# Patient Record
Sex: Female | Born: 1980 | Race: White | Hispanic: Yes | Marital: Married | Smoking: Never smoker
Health system: Southern US, Community
[De-identification: ages and names within clinical notes are randomized; demographics above are authoritative.]

## PROBLEM LIST (undated history)

## (undated) DIAGNOSIS — T7840XA Allergy, unspecified, initial encounter: Secondary | ICD-10-CM

## (undated) DIAGNOSIS — F419 Anxiety disorder, unspecified: Secondary | ICD-10-CM

## (undated) DIAGNOSIS — T8859XA Other complications of anesthesia, initial encounter: Secondary | ICD-10-CM

## (undated) DIAGNOSIS — Z789 Other specified health status: Secondary | ICD-10-CM

## (undated) HISTORY — DX: Other complications of anesthesia, initial encounter: T88.59XA

## (undated) HISTORY — DX: Allergy, unspecified, initial encounter: T78.40XA

## (undated) HISTORY — DX: Anxiety disorder, unspecified: F41.9

---

## 2021-08-25 ENCOUNTER — Encounter (HOSPITAL_COMMUNITY): Payer: Self-pay | Admitting: Emergency Medicine

## 2021-08-25 ENCOUNTER — Emergency Department (HOSPITAL_COMMUNITY): Payer: BC Managed Care – PPO

## 2021-08-25 ENCOUNTER — Emergency Department (HOSPITAL_COMMUNITY)
Admission: EM | Admit: 2021-08-25 | Discharge: 2021-08-25 | Disposition: A | Discharge status: Home / Self Care | Payer: BC Managed Care – PPO | Attending: Obstetrics & Gynecology | Admitting: Obstetrics & Gynecology

## 2021-08-25 DIAGNOSIS — Z3A01 Less than 8 weeks gestation of pregnancy: Secondary | ICD-10-CM | POA: Diagnosis not present

## 2021-08-25 DIAGNOSIS — R109 Unspecified abdominal pain: Secondary | ICD-10-CM | POA: Insufficient documentation

## 2021-08-25 DIAGNOSIS — O26891 Other specified pregnancy related conditions, first trimester: Secondary | ICD-10-CM | POA: Diagnosis not present

## 2021-08-25 DIAGNOSIS — O4690 Antepartum hemorrhage, unspecified, unspecified trimester: Secondary | ICD-10-CM

## 2021-08-25 DIAGNOSIS — O3680X0 Pregnancy with inconclusive fetal viability, not applicable or unspecified: Secondary | ICD-10-CM | POA: Diagnosis not present

## 2021-08-25 DIAGNOSIS — O209 Hemorrhage in early pregnancy, unspecified: Secondary | ICD-10-CM | POA: Diagnosis not present

## 2021-08-25 DIAGNOSIS — Z6790 Unspecified blood type, Rh positive: Secondary | ICD-10-CM

## 2021-08-25 DIAGNOSIS — O09519 Supervision of elderly primigravida, unspecified trimester: Secondary | ICD-10-CM | POA: Diagnosis not present

## 2021-08-25 DIAGNOSIS — Z3A00 Weeks of gestation of pregnancy not specified: Secondary | ICD-10-CM | POA: Diagnosis not present

## 2021-08-25 HISTORY — DX: Other specified health status: Z78.9

## 2021-08-25 LAB — CBC
HCT: 36.7 % (ref 36.0–46.0)
Hemoglobin: 13.2 g/dL (ref 12.0–15.0)
MCH: 33.1 pg (ref 26.0–34.0)
MCHC: 36 g/dL (ref 30.0–36.0)
MCV: 92 fL (ref 80.0–100.0)
Platelets: 316 10*3/uL (ref 150–400)
RBC: 3.99 MIL/uL (ref 3.87–5.11)
RDW: 11.5 % (ref 11.5–15.5)
WBC: 6.3 10*3/uL (ref 4.0–10.5)
nRBC: 0 % (ref 0.0–0.2)

## 2021-08-25 LAB — ABO/RH: ABO/RH(D): A POS

## 2021-08-25 LAB — WET PREP, GENITAL
Clue Cells Wet Prep HPF POC: NONE SEEN
Sperm: NONE SEEN
Trich, Wet Prep: NONE SEEN
WBC, Wet Prep HPF POC: <10 (ref <10)
Yeast Wet Prep HPF POC: NONE SEEN

## 2021-08-25 LAB — HCG, QUANTITATIVE, PREGNANCY: hCG, Beta Chain, Quant, S: 21 m[IU]/mL — ABNORMAL HIGH (ref <5)

## 2021-08-25 LAB — I-STAT BETA HCG BLOOD, ED (MC, WL, AP ONLY): I-stat hCG, quantitative: 25.1 m[IU]/mL — ABNORMAL HIGH (ref <5)

## 2021-08-25 NOTE — MAU Note (Signed)
Sent up from ER.  +HPT x2 yesterday. Had some dark spotting last night, was red spotting this morning.  Last night had a little cramping. cramping in lower abd and lower back.  Feeling a little dizzy

## 2021-08-25 NOTE — ED Provider Notes (Signed)
Patient is a 41 year old female who presents to the emergency department from urgent care for further evaluation of vaginal bleeding and abdominal cramping x1 day.  Patient had a positive urine pregnancy test at urgent care.  Beta-hCG obtained in ER triage, positive at 25.1.  She is alert, oriented, and in no acute distress in triage.  She is hemodynamically stable.  Called MAU APP who accepted patient transfer.   Estill Cotta 08/25/21 1543    Milton Ferguson, MD 08/27/21 1125

## 2021-08-25 NOTE — ED Triage Notes (Signed)
Patient to [facility] from urgent care for further evaluation of vaginal bleeding and cramping x1 day with positive pregnancy test. Patient alert, oriented, and in no apparent distress at this time.

## 2021-08-25 NOTE — MAU Provider Note (Signed)
History     CSN: [id number]  Arrival date and time: 08/25/21 1320   Event Date/Time   First Provider Initiated Contact with Patient 08/25/21 1704      Chief Complaint  Patient presents with   Vaginal Bleeding   Abdominal Pain   Back Pain   41 y.o. [id number] @[redacted]w[redacted]d  by sure LMP presenting with VB and cramping. Reports onset today. Bleeding has been light and just spotting. Cramping is mild and intermittent. Rates 2/10. Has not treated it. Had 2 positive HPT yesterday.   OB History     Gravida  5   Para  2   Term      Preterm      AB  2   Living  2      SAB      IAB  2   Ectopic      Multiple      Live Births              Past Medical History:  Diagnosis Date   Medical history non-contributory     Past Surgical History:  Procedure Laterality Date   CESAREAN SECTION      History reviewed. No pertinent family history.  Social History   Tobacco Use   Smoking status: Never   Smokeless tobacco: Never  Vaping Use   Vaping Use: Never used  Substance Use Topics   Alcohol use: Never   Drug use: Never    Allergies:  Allergies  Allergen Reactions   Latex     No medications prior to admission.    Review of Systems  Constitutional:  Negative for fever.  Gastrointestinal:  Positive for abdominal pain. Negative for diarrhea, nausea and vomiting.  Genitourinary:  Positive for vaginal bleeding. Negative for vaginal discharge.  Physical Exam   Blood pressure 113/89, pulse 77, temperature 98.9 F (37.2 C), temperature source Oral, resp. rate 18, weight 81.1 kg, last menstrual period 07/23/2021, SpO2 100 %.  Physical Exam Vitals and nursing note reviewed.  Constitutional:      General: She is not in acute distress.    Appearance: Normal appearance.  HENT:     Head: Normocephalic and atraumatic.  Cardiovascular:     Rate and Rhythm: Normal rate.  Pulmonary:     Effort: Pulmonary effort is normal. No respiratory distress.  Abdominal:      General: There is no distension.     Palpations: Abdomen is soft. There is no mass.     Tenderness: There is no abdominal tenderness. There is no guarding or rebound.     Hernia: No hernia is present.  Musculoskeletal:        General: Normal range of motion.     Cervical back: Normal range of motion.  Skin:    General: Skin is warm and dry.  Neurological:     General: No focal deficit present.     Mental Status: She is alert and oriented to person, place, and time.  Psychiatric:        Mood and Affect: Mood normal.        Behavior: Behavior normal.   Results for orders placed or performed during the hospital encounter of 08/25/21 (from the past 24 hour(s))  I-Stat beta hCG blood, ED     Status: Abnormal   Collection Time: 08/25/21  3:23 PM  Result Value Ref Range   I-stat hCG, quantitative 25.1 (H) <5 mIU/mL   Comment 3  ABO/Rh     Status: None   Collection Time: 08/25/21  4:33 PM  Result Value Ref Range   ABO/RH(D) A POS    No rh immune globuloin      NOT A RH IMMUNE GLOBULIN CANDIDATE, PT RH POSITIVE Performed at [facility] [address], [place], [place] [address]   CBC     Status: None   Collection Time: 08/25/21  4:33 PM  Result Value Ref Range   WBC 6.3 4.0 - 10.5 K/uL   RBC 3.99 3.87 - 5.11 MIL/uL   Hemoglobin 13.2 12.0 - 15.0 g/dL   HCT 36.7 36.0 - 46.0 %   MCV 92.0 80.0 - 100.0 fL   MCH 33.1 26.0 - 34.0 pg   MCHC 36.0 30.0 - 36.0 g/dL   RDW 11.5 11.5 - 15.5 %   Platelets 316 150 - 400 K/uL   nRBC 0.0 0.0 - 0.2 %  hCG, quantitative, pregnancy     Status: Abnormal   Collection Time: 08/25/21  4:33 PM  Result Value Ref Range   hCG, Beta Chain, Quant, S 21 (H) <5 mIU/mL  Wet prep, genital     Status: None   Collection Time: 08/25/21  5:17 PM   Specimen: PATH Cytology Cervicovaginal Ancillary Only  Result Value Ref Range   Yeast Wet Prep HPF POC NONE SEEN NONE SEEN   Trich, Wet Prep NONE SEEN NONE SEEN   Clue Cells Wet Prep HPF POC NONE SEEN  NONE SEEN   WBC, Wet Prep HPF POC <10 <10   Sperm NONE SEEN    US OB LESS THAN 14 WEEKS WITH OB TRANSVAGINAL  Result Date: 08/25/2021 CLINICAL DATA:  Vaginal bleeding EXAM: OBSTETRIC <14 WK [place] AND TRANSVAGINAL OB US TECHNIQUE: Both transabdominal and transvaginal ultrasound examinations were performed for complete evaluation of the gestation as well as the maternal uterus, adnexal regions, and pelvic cul-de-sac. Transvaginal technique was performed to assess early pregnancy. COMPARISON:  None. FINDINGS: Intrauterine gestational sac: None Yolk sac:  Not Visualized. Embryo:  Not Visualized. Cardiac Activity: Not Visualized. Heart Rate:   bpm MSD:   mm    w     d CRL:    mm    w    d                  [place] EDC: Subchorionic hemorrhage:  None visualized. Maternal uterus/adnexae: No adnexal mass or free fluid. IMPRESSION: No intrauterine pregnancy visualized. Differential considerations would include early intrauterine pregnancy too early to visualize, spontaneous abortion, or occult ectopic pregnancy. Recommend close clinical followup and serial quantitative beta HCGs and ultrasounds. Electronically Signed   By: Rolm Baptise M.D.   On: 08/25/2021 17:57    MAU Course  Procedures  MDM Labs and [place] ordered and reviewed. Qhcg low and no IUGS, YS or FP seen on [place], findings likely indicate early pregnancy, but cannot r/o ectopic pregnancy, or failed pregnancy, discussed with pt. Will follow quant in 48 hrs. Stable for discharge home.   Assessment and Plan   1. Pregnancy, location unknown   2. Vaginal bleeding in pregnancy   3. Blood type, Rh positive    Discharge home Follow up at [facility] on 08/28/21 @0900  SAB/ectopic precautions Pelvic rest  Allergies as of 08/25/2021       Reactions   Latex         Medication List    You have not been prescribed any medications.    Threasa Beards  Drake Leach, CNM 08/25/2021, 6:42 PM

## 2021-08-26 LAB — GC/CHLAMYDIA PROBE AMP (~~LOC~~) NOT AT ARMC
Chlamydia: NEGATIVE
Comment: NEGATIVE
Comment: NORMAL
Neisseria Gonorrhea: NEGATIVE

## 2021-08-28 ENCOUNTER — Ambulatory Visit (INDEPENDENT_AMBULATORY_CARE_PROVIDER_SITE_OTHER): Payer: BC Managed Care – PPO | Admitting: *Deleted

## 2021-08-28 ENCOUNTER — Other Ambulatory Visit: Payer: Self-pay

## 2021-08-28 VITALS — BP 118/83 | HR 73 | Ht 67.0 in | Wt 178.5 lb

## 2021-08-28 DIAGNOSIS — O3680X0 Pregnancy with inconclusive fetal viability, not applicable or unspecified: Secondary | ICD-10-CM | POA: Diagnosis not present

## 2021-08-28 DIAGNOSIS — Z77011 Contact with and (suspected) exposure to lead: Secondary | ICD-10-CM | POA: Diagnosis not present

## 2021-08-28 LAB — BETA HCG QUANT (REF LAB): hCG Quant: 2 m[IU]/mL

## 2021-08-28 NOTE — Progress Notes (Signed)
Patient was assessed and managed by nursing staff during this encounter. I have reviewed the chart and agree with the documentation and plan.   Jaynie Collins, MD 08/28/2021 1:38 PM

## 2021-08-28 NOTE — Progress Notes (Signed)
Stat bhcg results received = 2. Reviewed results and assessment with Dr. Harolyn Rutherford as Dr. Dione Plover not available. Results indicate patient has had a miscarriage and is not pregnant. She does not need any further lab draws but may be offered sab followup if desired.  I called patient with [facility] # [phone] and left a message I was calling with results and as we are closed , she should call the office Monday for results. Tibor Lemmons,RN

## 2021-08-28 NOTE — Progress Notes (Signed)
Hx MAU 08/25/21 with vaginal bleeding, abd and back pain. Had stat bhcg and US done. Here today for stat bhcg. Denies pain.States has been spotting a little since MAU visit- states sees tissue sometimes.Has been wearing pads.  Explained we will draw stat bhcg and have her leave office. She will be called with results in about 2 hours after results received and reviewed by provider.  She voices understanding.   She also asked if exposure to lead can cause miscarriage. 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 She wears masks and gloves. Discussed with Dr.Eckstat and he ordered lead level to see if her PPE is protecting her because exposure to lead does increase risk of miscarriage. Explained to patient and she agrees with plan.  Roland Prine,RN

## 2021-08-28 NOTE — Progress Notes (Signed)
I called patient again with [employer] [phone] and left another message I am calling with results, and since we are closed she can call our office Monday for results  Ennio Houp,RN

## 2021-08-31 NOTE — Progress Notes (Signed)
Pt came to office today for results and recommendations since not being able to be reached on Friday.  Spoke with pt in serenity room with [employer]quel-interpreter. Pt given results of SAB and answered all questions. Pt also asking about LEAD labs. Pt advised results are not back yet,but will be notified by provider if positive. Pt verbalized understanding.  Jasmine Ho

## 2021-09-01 LAB — LEAD, BLOOD (ADULT >= 16 YRS): Lead-Whole Blood: 19.4 ug/dL — ABNORMAL HIGH (ref 0.0–3.4)

## 2021-09-04 NOTE — Progress Notes (Signed)
Please call patient and let her know the results. I have reviewed the literature and there is an association between lead levels and spontaneous miscarriage, so this may have contributed to her pregnancy loss though it has to be said there are likely many other contributing factors.

## 2021-09-07 ENCOUNTER — Telehealth: Payer: Self-pay

## 2021-09-07 NOTE — Telephone Encounter (Addendum)
-----   Message from Venora Maples, MD sent at 09/04/2021  2:12 PM EST ----- Please call patient and let her know the results. I have reviewed the literature and there is an association between lead levels and spontaneous miscarriage, so this may have contributed to her pregnancy loss though it has to be said there are likely many other contributing factors.   Called pt with [employer] Ra[employer] M., left message that I am calling with results to please return call.    Leonette Nutting  09/07/21

## 2021-09-08 NOTE — Telephone Encounter (Signed)
Called pt with interpreter Raquel. Reviewed that there is association between lead levels and spontaneous miscarriage. Pt asks what is normal level and what was her exact result; reviewed normal parameters and result with patient. Pt asks if there is anything she would do to lower these levels and to prevent future exposure. Explained I will review questions with a provider and someone will call to follow up.

## 2021-09-15 ENCOUNTER — Telehealth: Payer: Self-pay | Admitting: Family Medicine

## 2021-09-15 NOTE — Telephone Encounter (Signed)
Discussed patient's lead level on the phone. Patient with many excellent questions.   First asked if there was anything she could do to reduce her lead level, specifically a medication. Reviewed that at her blood level (19) it is not recommended to take any medications. 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 She should also separate work clothes from regular clothes.  Also asked what optimal lead level is, we discussed it is 0, though technically OSHA recommends <30.   She also asked if there as an association with lead and miscarriage. We discussed there is, though not definitively proven and she has many other confounding factors such as her age.   Asked if there was anything else she could be doing to help her get pregnant or if she should wait to try. We discussed prenatals and trying immediately given her age as likely the best course of action.   All questions answered. 13 minutes in total spent reviewing chart and speaking with patient.

## 2021-10-15 ENCOUNTER — Encounter: Payer: Self-pay | Admitting: Family Medicine

## 2021-10-15 ENCOUNTER — Ambulatory Visit (INDEPENDENT_AMBULATORY_CARE_PROVIDER_SITE_OTHER): Payer: BC Managed Care – PPO | Admitting: Family Medicine

## 2021-10-15 ENCOUNTER — Other Ambulatory Visit: Payer: Self-pay

## 2021-10-15 DIAGNOSIS — F4321 Adjustment disorder with depressed mood: Secondary | ICD-10-CM

## 2021-10-15 DIAGNOSIS — Z7689 Persons encountering health services in other specified circumstances: Secondary | ICD-10-CM

## 2021-10-15 DIAGNOSIS — Z789 Other specified health status: Secondary | ICD-10-CM | POA: Diagnosis not present

## 2021-10-15 MED ORDER — CITALOPRAM HYDROBROMIDE 10 MG PO TABS: 10.0000 mg | ORAL_TABLET | Freq: Every day | ORAL | 0 refills | Status: DC

## 2021-10-15 NOTE — Progress Notes (Signed)
Patient is her to establish care.Patient is c/o her hips hurting from injection.Patient had injection 14yrs ago and has scar tissues that is bothering her. ?

## 2021-10-16 NOTE — Progress Notes (Signed)
? ?  New Patient Office Visit ? ?Subjective:  ?Patient ID: Jasmine Ho, female    DOB: September 08, 1980  Age: 41 y.o. MRN: [id number] ? ?CC:  ?Chief Complaint  ?Patient presents with  ? Establish Care  ? ? ?HPI ?Jasmine Ho presents for to establish care. Patient reports that she has had a lot of social stressors recently but that her now husband has been supportive. This visit was aided by an interpreter.  ? ?Past Medical History:  ?Diagnosis Date  ? Medical history non-contributory   ? ? ?Past Surgical History:  ?Procedure Laterality Date  ? CESAREAN SECTION    ? ? ?History reviewed. No pertinent family history. ? ?Social History  ? ?Socioeconomic History  ? Marital status: Married  ?  Spouse name: Not on file  ? Number of children: Not on file  ? Years of education: Not on file  ? Highest education level: Not on file  ?Occupational History  ? Not on file  ?Tobacco Use  ? Smoking status: Never  ? Smokeless tobacco: Never  ?Vaping Use  ? Vaping Use: Never used  ?Substance and Sexual Activity  ? Alcohol use: Never  ? Drug use: Never  ? Sexual activity: Yes  ?Other Topics Concern  ? Not on file  ?Social History Narrative  ? Not on file  ? ?Social Determinants of Health  ? ?Financial Resource Strain: Not on file  ?Food Insecurity: Not on file  ?Transportation Needs: Not on file  ?Physical Activity: Not on file  ?Stress: Not on file  ?Social Connections: Not on file  ?Intimate Partner Violence: Not on file  ? ? ?ROS ?Review of Systems  ?Psychiatric/Behavioral:  Positive for sleep disturbance. Negative for self-injury and suicidal ideas. The patient is not nervous/anxious.   ?All other systems reviewed and are negative. ? ?Objective:  ? ?Today's Vitals: LMP 07/23/2021 (Exact Date)  ? ?Physical Exam ?Vitals and nursing note reviewed.  ?Constitutional:   ?   General: She is not in acute distress. ?Cardiovascular:  ?   Rate and Rhythm: Normal rate and regular rhythm.  ?Pulmonary:  ?   Effort: Pulmonary effort is normal.  ?    Breath sounds: Normal breath sounds.  ?Abdominal:  ?   Palpations: Abdomen is soft.  ?   Tenderness: There is no abdominal tenderness.  ?Neurological:  ?   General: No focal deficit present.  ?   Mental Status: She is alert and oriented to person, place, and time.  ?Psychiatric:     ?   Mood and Affect: Mood and affect normal.     ?   Behavior: Behavior normal. Behavior is cooperative.  ? ? ?Assessment & Plan:  ? ?1. Situational depression ?Patient prescribed Celexa 10 mg daily. Will monitor ? ?2. Language barrier to communication ? ? ?3. Encounter to establish care ? ? ? ? ?Outpatient Encounter Medications as of 10/15/2021  ?Medication Sig  ? citalopram (CELEXA) 10 MG tablet Take 1 tablet (10 mg total) by mouth daily.  ? ?No facility-administered encounter medications on file as of 10/15/2021.  ? ? ?Follow-up: Return in about 4 weeks (around 11/12/2021) for physical.  ? ?Jasmine Raymond, MD ? ?

## 2021-11-16 ENCOUNTER — Ambulatory Visit (INDEPENDENT_AMBULATORY_CARE_PROVIDER_SITE_OTHER): Payer: BC Managed Care – PPO

## 2021-11-16 DIAGNOSIS — Z3201 Encounter for pregnancy test, result positive: Secondary | ICD-10-CM

## 2021-11-16 LAB — POCT PREGNANCY, URINE: Preg Test, Ur: POSITIVE — AB

## 2021-11-16 NOTE — Progress Notes (Signed)
Patient dropped off urine for pregnancy test. Urine pregnancy test positive. I called patient with the help of Raquel spanish interpreter to review these results. Per patient she had a positive pregnancy test at home on 11/15/21. Per patient she has some mild discomfort in her abdomen but denies any vaginal bleeding. I reviewed ectopic and bleeding precautions with patient. Per patient her LMP was 10/19/21 making her [redacted]w[redacted]d today with an EDD of 07/26/22. Per patient she would like to receive prenatal care here at the [facility]. New OB appointment scheduled. I recommended patient begin taking a prenatal vitamin. Patient denies any other questions or concerns.  ? ?Alesia Richards, RN ?11/16/21 ?

## 2021-12-16 ENCOUNTER — Encounter: Payer: BC Managed Care – PPO | Admitting: Family Medicine

## 2021-12-23 ENCOUNTER — Telehealth (INDEPENDENT_AMBULATORY_CARE_PROVIDER_SITE_OTHER): Payer: BC Managed Care – PPO

## 2021-12-23 DIAGNOSIS — Z98891 History of uterine scar from previous surgery: Secondary | ICD-10-CM

## 2021-12-23 DIAGNOSIS — Z3480 Encounter for supervision of other normal pregnancy, unspecified trimester: Secondary | ICD-10-CM | POA: Insufficient documentation

## 2021-12-23 DIAGNOSIS — B009 Herpesviral infection, unspecified: Secondary | ICD-10-CM

## 2021-12-23 DIAGNOSIS — O09521 Supervision of elderly multigravida, first trimester: Secondary | ICD-10-CM | POA: Insufficient documentation

## 2021-12-23 DIAGNOSIS — Z3A00 Weeks of gestation of pregnancy not specified: Secondary | ICD-10-CM

## 2021-12-23 NOTE — Progress Notes (Signed)
New OB Intake ? ?I connected with  [name] on 12/23/21 at  1:15 PM EDT by MyChart Video Visit and verified that I am speaking with the correct person using two identifiers. Nurse is located at [facility] and pt is located at [facility]. ? ?I discussed the limitations, risks, security and privacy concerns of performing an evaluation and management service by telephone and the availability of in person appointments. I also discussed with the patient that there may be a patient responsible charge related to this service. The patient expressed understanding and agreed to proceed. ? ?I explained I am completing New OB Intake today. We discussed her EDD of 07/26/22 that is based on LMP of 10/19/21. Pt is G6/P2. I reviewed her allergies, medications, Medical/Surgical/OB history, and appropriate screenings. I informed her of [facility] services. Based on history, this is a/an  pregnancy uncomplicated .  ? ?There are no problems to display for this patient. ? ? ?Concerns addressed today ? ?Delivery Plans:  ?Plans to deliver at [facility].  ? ?MyChart/Babyscripts ?MyChart access verified. I explained pt will have some visits in office and some virtually. Babyscripts instructions given and order placed. Patient verifies receipt of registration text/e-mail. Account successfully created and app downloaded. ? ?Blood Pressure Cuff  ?Pt has her own BP Cuff, Explained after first prenatal appt pt will check weekly and document in Babyscripts. ? ?Weight scale: Patient does / does not  have weight scale. Weight scale ordered for patient to pick up from [employer].  ? ?Anatomy US ?Explained first scheduled Korea will be around 19 weeks. Anatomy US scheduled for 03/02/22 at 0945. Pt notified to arrive at 0930. ?Scheduled AFP lab only appointment if CenteringPregnancy pt for same day as anatomy US.  ? ?Labs ?Discussed Avelina Laine genetic screening with patient. Would like both Panorama and Horizon drawn at new OB visit.Also if interested in  genetic testing, tell patient she will need AFP 15-21 weeks to complete genetic testing .Routine prenatal labs needed. ? ?Covid Vaccine ?Patient has covid vaccine.  ? ?Is patient a CenteringPregnancy candidate?  ?Not a Candidate ?Declined due to NA ?Not a candidate due to LanguageIs patient a CenteringPregnancy candidate? Not a candidate due to Spanish   "Centering Patient" indicated on sticky note ?  ?Is patient a Mom+Baby Combined Care candidate?  ?Not a candidate   ? Scheduled with Mom+Baby provider  ?  ?Is patient interested in [place]?  ?No  ? "Interested in [employer] - Schedule next visit with CNM" on sticky note ? ?Informed patient of [facility] website  and placed link in her AVS.  ? ?Social Determinants of Health ?Food Insecurity: Patient denies food insecurity. ?WIC Referral: Patient is interested in referral to [facility].  ?Transportation: Patient denies transportation needs. ?Childcare: Discussed no children allowed at ultrasound appointments. Offered childcare services; patient declines childcare services at this time. ? ?Send link to Pregnancy Navigators ? ? ?Placed OB Box on problem list and updated ? ?First visit review ?I reviewed new OB appt with pt. I explained she will have a pelvic exam, ob bloodwork with genetic screening, and PAP smear. Explained pt will be seen by Dr. Mathis Fare at first visit; encounter routed to appropriate provider. Explained that patient will be seen by pregnancy navigator following visit with provider. [facility] information placed in AVS.  ? ?Henrietta Dine, CMA ?12/23/2021  1:25 PM  ?

## 2021-12-23 NOTE — Patient Instructions (Signed)
AREA PEDIATRIC/FAMILY PRACTICE PHYSICIANS ? ?[place] ([address]) ?[facility] ?Chambliss, MD; Eniola, MD; Hale, MD; Hensel, MD; McDiarmid, MD; McIntyer, MD; Chanc Kervin, MD; Walden, MD ?[address] [place], [place] [address] ?[phone] ?Mon-Fri 8:30-12:30, 1:30-5:00 ?Providers come to see babies at [facility] ?Accepting Medicaid ?[facility] ?Limited providers who accept newborns: Koirala, MD; Morrow, MD; Wolters, MD ?[address], [place], [place] [address] ?[phone] ?Mon-Fri 8:00-5:30 ?Babies seen by providers at [facility] ?Does NOT accept Medicaid ?Please call early in hospitalization for appointment (limited availability)  ?[facility] ?Mulberry, MD ?[address] [place], [place] [address] ?[phone] ?Mon, Tue, Thur, Fri 8:30-5:00, Wed 10:00-7:00 (closed 1-2pm) ?Babies seen by [facility] providers ?Accepting Medicaid ?[facility] Pediatric[facility] ?Rubin, MD ?[address], [place], [place] [address] ?[phone] ?Mon-Fri 8:30-5:00, Sat 8:30-12:00 ?Provider comes to see babies at [facility] ?Accepting Medicaid ?Must have been referred from current patients or contacted office prior to delivery ?[facility] ([facility]) ?Brown, MD; Chandler, MD; Ettefagh, MD; Grant, MD; Lester, MD; McCormick, MD; McQueen, MD; Prose, MD; Simha, MD; Stanley, MD; Stryffeler, NP; Tebben, NP ?[address], [place], [place] [address] ?[phone] ?Mon, Tue, Thur, Fri 8:30-5:30, Wed 9:30-5:30, Sat 8:30-12:30 ?Babies seen by [facility] providers ?Accepting Medicaid ?Only accepting infants of first-time parents or siblings of current patients ?Hospital discharge coordinator will make follow-up appointment ?Jack Amos ?[address], [place], [place]  [address] ?[phone]   Fax - [phone] ?[facility] ?[address], [place], [place]  [address] ?Phone - [phone]   Fax - [phone] ?Shilpa Gosrani ?[address], [place], [place]  [address] ?[phone] ? ?[place] ([address]) ?[facility] ?Bates, MD; Brassfield, MD; Cooper, Cox, MD; MD; Davis, MD; Dovico, MD; Ettefaugh, MD; Little, MD; Lowe, MD; Keiffer, MD; Melvin, MD; Sumner, MD; Williams, MD ?[address], [place], [place] [address] ?[phone] ?Mon-Fri 8:30-5:00 (extended evenings Mon-Thur as needed), Sat-Sun 10:00-1:00 ?Providers come to see babies at [facility] ?Accepting Medicaid for families of first-time babies and families with all children in the household age 3 and under. Must register with office prior to making appointment (M-F only). ?[facility] ?Henson, NP; Knapp, MD; Lalonde, MD; Tysinger, PA ?[address] [place], [place] [address] ?[phone] ?Mon-Fri 8:00-5:00 ?Babies seen by providers at [facility] ?Does NOT accept Medicaid/Commercial Insurance Only ?[facility] ([facility])  ?Artis, MD; Barnes, MD; Bratton, MD; Coccaro, MD; Lockett Gardner, MD; Kramer, MD; Marshall, MD; Netherton, MD; Poleto, MD; Skinner, MD ?[address] [place], [place] [address] ?[phone] ?Mon-Fri 8:30-5:30, Sat (Oct.-Mar.) 9:00-1:00 ?Babies seen by providers at [facility] ?Accepting Medicaid ? ?[place] ([address]) ?[facility] ?Reid, MD; Warner, MD ?[address], [place],  [address] ?[phone] ?Mon-Fri 8:30-5:00, Sat 8:30-12:00 ?Providers come to see babies at [facility] ?Does NOT accept Medicaid ?[facility] ?Becker, PA; Hagler, MD; Scifres, PA; Sun, MD; Swayne, MD ?[address], [place],  [address] ?[phone] ?Mon-Fri 8:00-5:00 ?Babies seen by providers at [facility] ?Does NOT accept Medicaid ?Only accepting babies of parents who  are patients ?Please call early in hospitalization for appointment (limited availability) ?[facility] ?Clark, MD; Frye, MD; Kelleher, MD; Mack, NP; [place], MD; O'Keller, MD; Patterson, NP; Pudlo, MD; Puzio, MD; Thomas, MD; Tucker, MD; Twiselton, MD ?[address], [place], [place] [address] ?[phone] ?Mon-Fri 8:00-5:00, Sat 9:00-12:00 ?Providers come to see babies at [facility] ?Does NOT accept Medicaid ? ?[place] ([address]) ?[facility] ?Limited providers accepting new patients: Brake, NP; Wharton, PA ?[address], [place], [place] [address] ?[phone] ?Mon-Fri 8:00-5:00 ?Babies seen by providers at [facility] ?Does NOT accept Medicaid ?Only accepting babies of parents who are patients ?Please call early in hospitalization for appointment (limited availability) ?[facility] ?Gay, MD; Quinlan, MD ?[address] [place], [place] [address] ?[phone] (press 1 to schedule appointment) ?Mon-Fri 8:00-5:00 ?Providers come to see babies at [facility] ?Does NOT accept Medicaid ?[facility] ?Mazer, MD ?[address] [place], [place] [address] ?[phone] ?Mon-Fri 8:30-5:00 (lunch 12:30-1:00), extended hours by appointment only Wed 5:00-6:30 ?Babies seen by [facility] providers ?Accepting Medicaid ?[facility] ?Banks, MD; Jordan, MD; Koberlein, MD ?[address], [place], [place] [address] ?[phone] ?Mon-Fri 8:00-5:00 ?Babies seen by [facility] providers ?Does NOT accept Medicaid ?[facility] ?Parker, MD; Hunter, MD; Wallace, DO ?[address] [place], [place] [address] ?[phone] ?Mon-Fri 8:00-5:00 ?Babies seen by [facility] providers ?Does NOT accept Medicaid ?[facility] ?Brandon, PA; Brecken, PA; Christy, NP; Dees, MD; DeClaire, MD; DeWeese, MD; Hansen, NP; [place], NP; Parrish, NP; Smoot, NP; Summer, MD; Vapne,  MD ?[address] [place], [place] [address] ?[phone] ?Mon-Fri 8:30-5:00, Sat 10:00-1:00 ?Providers come to see babies at [facility] ?Does NOT accept Medicaid ?Free prenatal information session Tuesdays at 4:45pm ?[facility] ?Bouska, MD; Gordon, PA; Jeffery, PA; Weber, PA ?[address] [place] [address] ?[phone] ?Mon-Fri 7:30-5:30 ?Babies seen by [facility] providers ?[facility] ?[address], [place], [place]  [address] ?[phone]   Fax - [phone] ? ?[place] ([address] & [address]) ?[facility] ?Reese, MD ?[address] [place], [place] [address] ?[phone] ?Mon-Thur 8:00-6:00 ?Providers come to see babies at [facility] ?Accepting Medicaid ?[facility] ?Anderson, NP; Badger, MD; Beal, PA; Spencer, PA ?[address] [place], [place] [address] ?[phone] ?Mon-Thur 7:30-7:30, Fri 7:30-4:30 ?Babies seen by [facility] providers ?Accepting Medicaid ?[facility] ?Agbuya, MD; Klett, NP; Romgoolam, MD ?[address], [place], [place] [address] ?[phone] ?Mon-Fri 8:30-5:00, Sat 8:30-12:00 ?Providers come to see babies at [facility] ?Accepting Medicaid ?Must have ?Meet & Greet? appointment at office prior to delivery ?[facility] ([facility]) ?McCord, MD; Wallace, MD; Wood, MD ?[address], [place], [place] [address] ?[phone] ?Mon-Wed 8:00-6:00, Thur-Fri 8:00-5:00, Sat 9:00-12:00 ?Providers come to see babies at [facility] ?Does NOT accept Medicaid ?Only accepting siblings of current patients ?[facility]  ?[address], [place], [place]  [address] ?[phone]   Fax - [phone] ?[facility] ?[address],  [place], [place]  [address] ?[phone]   Fax -  [phone] ? ?[place]  ([address] & [address]) ?[facility] ?Cirigliano, DO; Matthews, DO ?[address] , [place] [address] ?[phone] ?Mon-Fri 7:00-5:00 ?Babies seen by [facility]

## 2021-12-25 ENCOUNTER — Encounter (HOSPITAL_COMMUNITY): Payer: Self-pay | Admitting: Obstetrics and Gynecology

## 2021-12-25 ENCOUNTER — Inpatient Hospital Stay (HOSPITAL_COMMUNITY)
Admission: AD | Admit: 2021-12-25 | Discharge: 2021-12-26 | Disposition: A | Discharge status: Home / Self Care | Attending: Obstetrics and Gynecology | Admitting: Obstetrics and Gynecology

## 2021-12-25 ENCOUNTER — Inpatient Hospital Stay (HOSPITAL_COMMUNITY)

## 2021-12-25 DIAGNOSIS — O26891 Other specified pregnancy related conditions, first trimester: Secondary | ICD-10-CM | POA: Diagnosis not present

## 2021-12-25 DIAGNOSIS — O09521 Supervision of elderly multigravida, first trimester: Secondary | ICD-10-CM | POA: Diagnosis not present

## 2021-12-25 DIAGNOSIS — Z3480 Encounter for supervision of other normal pregnancy, unspecified trimester: Secondary | ICD-10-CM

## 2021-12-25 DIAGNOSIS — Z3A09 9 weeks gestation of pregnancy: Secondary | ICD-10-CM | POA: Diagnosis not present

## 2021-12-25 DIAGNOSIS — N939 Abnormal uterine and vaginal bleeding, unspecified: Secondary | ICD-10-CM | POA: Diagnosis not present

## 2021-12-25 DIAGNOSIS — O039 Complete or unspecified spontaneous abortion without complication: Secondary | ICD-10-CM | POA: Diagnosis not present

## 2021-12-25 DIAGNOSIS — R102 Pelvic and perineal pain: Secondary | ICD-10-CM | POA: Diagnosis not present

## 2021-12-25 DIAGNOSIS — B009 Herpesviral infection, unspecified: Secondary | ICD-10-CM

## 2021-12-25 DIAGNOSIS — Z98891 History of uterine scar from previous surgery: Secondary | ICD-10-CM

## 2021-12-25 DIAGNOSIS — R110 Nausea: Secondary | ICD-10-CM | POA: Insufficient documentation

## 2021-12-25 DIAGNOSIS — Z3A10 10 weeks gestation of pregnancy: Secondary | ICD-10-CM | POA: Diagnosis not present

## 2021-12-25 LAB — CBC WITH DIFFERENTIAL/PLATELET
Abs Immature Granulocytes: 0.02 10*3/uL (ref 0.00–0.07)
Basophils Absolute: 0 10*3/uL (ref 0.0–0.1)
Basophils Relative: 0 %
Eosinophils Absolute: 0.3 10*3/uL (ref 0.0–0.5)
Eosinophils Relative: 4 %
HCT: 38.1 % (ref 36.0–46.0)
Hemoglobin: 13.1 g/dL (ref 12.0–15.0)
Immature Granulocytes: 0 %
Lymphocytes Relative: 36 %
Lymphs Abs: 3.2 10*3/uL (ref 0.7–4.0)
MCH: 32.3 pg (ref 26.0–34.0)
MCHC: 34.4 g/dL (ref 30.0–36.0)
MCV: 94.1 fL (ref 80.0–100.0)
Monocytes Absolute: 0.7 10*3/uL (ref 0.1–1.0)
Monocytes Relative: 7 %
Neutro Abs: 4.7 10*3/uL (ref 1.7–7.7)
Neutrophils Relative %: 53 %
Platelets: 300 10*3/uL (ref 150–400)
RBC: 4.05 MIL/uL (ref 3.87–5.11)
RDW: 12.1 % (ref 11.5–15.5)
WBC: 9 10*3/uL (ref 4.0–10.5)
nRBC: 0 % (ref 0.0–0.2)

## 2021-12-25 LAB — COMPREHENSIVE METABOLIC PANEL
ALT: 28 U/L (ref 0–44)
AST: 25 U/L (ref 15–41)
Albumin: 3.6 g/dL (ref 3.5–5.0)
Alkaline Phosphatase: 72 U/L (ref 38–126)
Anion gap: 7 (ref 5–15)
BUN: 6 mg/dL (ref 6–20)
CO2: 26 mmol/L (ref 22–32)
Calcium: 8.9 mg/dL (ref 8.9–10.3)
Chloride: 104 mmol/L (ref 98–111)
Creatinine, Ser: 0.6 mg/dL (ref 0.44–1.00)
GFR, Estimated: >60 mL/min (ref >60)
Glucose, Bld: 103 mg/dL — ABNORMAL HIGH (ref 70–99)
Potassium: 3.5 mmol/L (ref 3.5–5.1)
Sodium: 137 mmol/L (ref 135–145)
Total Bilirubin: 0.4 mg/dL (ref 0.3–1.2)
Total Protein: 6.9 g/dL (ref 6.5–8.1)

## 2021-12-25 LAB — HCG, QUANTITATIVE, PREGNANCY: hCG, Beta Chain, Quant, S: 4675 m[IU]/mL — ABNORMAL HIGH (ref <5)

## 2021-12-25 LAB — ABO/RH: ABO/RH(D): A POS

## 2021-12-25 MED ORDER — MISOPROSTOL 200 MCG PO TABS
800.0000 ug | ORAL_TABLET | Freq: Once | ORAL | Status: AC
  Administered 2021-12-25: 800 ug via ORAL
  Filled 2021-12-25: qty 4

## 2021-12-25 MED ORDER — OXYCODONE HCL 5 MG PO TABS
5.0000 mg | ORAL_TABLET | ORAL | Status: DC | PRN
Start: 2021-12-25 — End: 2021-12-26
  Administered 2021-12-25: 5 mg via ORAL
  Filled 2021-12-25: qty 1

## 2021-12-25 MED ORDER — KETOROLAC TROMETHAMINE 30 MG/ML IJ SOLN
60.0000 mg | Freq: Once | INTRAMUSCULAR | Status: AC
  Administered 2021-12-25: 60 mg via INTRAMUSCULAR
  Filled 2021-12-25: qty 2

## 2021-12-25 MED ORDER — ACETAMINOPHEN 500 MG PO TABS
1000.0000 mg | ORAL_TABLET | Freq: Once | ORAL | Status: AC
  Administered 2021-12-25: 1000 mg via ORAL
  Filled 2021-12-25: qty 2

## 2021-12-25 MED ORDER — ONDANSETRON 4 MG PO TBDP
4.0000 mg | ORAL_TABLET | Freq: Once | ORAL | Status: AC
  Administered 2021-12-25: 4 mg via ORAL
  Filled 2021-12-25: qty 1

## 2021-12-25 NOTE — MAU Note (Signed)
Pt says has lower abd pain- started 12 nonn Pain worse now- no meds VB started at 1pm - brown when she wipes Appointment for [facility] - 5-23

## 2021-12-25 NOTE — MAU Provider Note (Signed)
History     CSN: [id number]  Arrival date and time: 12/25/21 2002   Event Date/Time   First Provider Initiated Contact with Patient 12/25/21 2038      Chief Complaint  Patient presents with   Vaginal Bleeding   Abdominal Pain   HPI 41 yo F [id number] at [redacted]w[redacted]d (by LMP) who presents with vaginal spotting initially (increased to heavier vaginal bleeding after being checked into MAU) and abdominal cramping.   Patient reports pain 6/10 and then increases in intensity when she passes clots. Hx of two prior SAB (earlier this year in Jan and also in 2007) .  Confirms 1st day of LMP 10/16/2021  OB History     Gravida  6   Para  2   Term  2   Preterm      AB  3   Living  2      SAB  2   IAB  1   Ectopic      Multiple      Live Births  2           Past Medical History:  Diagnosis Date   Complication of anesthesia    Medical history non-contributory     Past Surgical History:  Procedure Laterality Date   CESAREAN SECTION      History reviewed. No pertinent family history.  Social History   Tobacco Use   Smoking status: Never   Smokeless tobacco: Never  Vaping Use   Vaping Use: Never used  Substance Use Topics   Alcohol use: Not Currently   Drug use: Never    Allergies:  Allergies  Allergen Reactions   Latex     Medications Prior to Admission  Medication Sig Dispense Refill Last Dose   Prenatal Vit-Fe Fumarate-FA (MULTIVITAMIN-PRENATAL) 27-0.8 MG TABS tablet Take 1 tablet by mouth daily at 12 noon.   12/25/2021   citalopram (CELEXA) 10 MG tablet Take 1 tablet (10 mg total) by mouth daily. (Patient not taking: Reported on 11/16/2021) 30 tablet 0     Review of Systems  Constitutional:  Negative for fever.  Gastrointestinal:  Positive for abdominal pain and nausea. Negative for diarrhea and vomiting.  Endocrine: Negative for polyuria.  Genitourinary:  Negative for dysuria and flank pain.  Musculoskeletal:  Negative for back pain.   Neurological:  Negative for dizziness, syncope, weakness, light-headedness and headaches.  Hematological:  Negative for adenopathy.  Psychiatric/Behavioral:  Negative for agitation.   All other systems reviewed and are negative. Physical Exam   Blood pressure 119/80, pulse 85, temperature 98.4 F (36.9 C), temperature source Oral, resp. rate 20, height 5\' 7"  (1.702 m), weight 86.1 kg, last menstrual period 10/19/2021, unknown if currently breastfeeding.  Physical Exam Vitals and nursing note reviewed. Exam conducted with a chaperone present.  HENT:     Head: Normocephalic.  Cardiovascular:     Rate and Rhythm: Normal rate.  Pulmonary:     Effort: Pulmonary effort is normal.  Abdominal:     General: Abdomen is flat. There is no distension.     Tenderness: There is abdominal tenderness in the right lower quadrant and left lower quadrant.  Genitourinary:    Comments: On pelvic exam small orange size clot noted on pad. On speculum exam clot noted at external os. Also with blood in vault. Some blood coming out of os- NOT brisk. No fetal tissue noted Skin:    Capillary Refill: Capillary refill takes less than 2 seconds.  Neurological:  General: No focal deficit present.     Mental Status: She is alert and oriented to person, place, and time.    MAU Course  Procedures TVUS FINDINGS: Intrauterine gestational sac: Single, located in the lower uterine segment   Yolk sac:  Not visualized   Embryo:  Visualized.   Cardiac Activity: Not Visualized.   Heart Rate: 0  bpm   CRL:  22.3 mm   8 w   6 d                  [place] EDC: 07/31/2022   Subchorionic hemorrhage:  None visualized.   Maternal uterus/adnexae: Intrauterine gestational sac is located within the lower uterine segment. No yolk sac visualized. Fetal pole without fetal cardiac activity. There is a small area of separation between the amnion and chorion. Both ovaries are visualized and are normal, possible corpus luteal  cyst on the left. Trace pelvic free fluid.   IMPRESSION: Single intrauterine pregnancy, fetal pole with crown-rump length of 22 mm but absent fetal heart tones. Findings meet definitive criteria for failed pregnancy. This follows SRU consensus guidelines: Diagnostic Criteria for Nonviable Pregnancy Early in the First Trimester. [facility] [phone].   MDM Moderate 41 yo F [id number] at [redacted]w[redacted]d (by LMP) who presents with vaginal spotting initially (increased to heavier vaginal bleeding after being checked into MAU) and abdominal cramping. On exam passing clots and cervix visually ~1 cm and blood coming through. HgB normal. [place] with CRL 66mm, pregnancy measuring [redacted]w[redacted]d, however no cardiac activity therefore diagnostic for failed pregnancy.   - Discussed common causes of SAB.  - Discussed that she should wait for one normal cycle and then they may begin trying again if she feels emotionally ready as well - Answered all questions   - Discussed options: expectant management, mife/misoprostol and D&E. Discussed the risks and benefits to each.   - We discussed dosing and process of misoprostol. Discussed normal bleeding and cramping with the medication. Discussed pain medication often times needed when medically induced for missed abortion. Discussed success rate is 90-95% depending on gestational age at the time.  - Briefly discussed surgical option and that since hemodynamically stable would be a scheduled procedure.  - She would like: Medical therapy  - patient hemodynamically stable, bleeding is present but not brisk, and pain is better controlled with pain medications given and therefore safe to discharge home.  Assessment and Plan  Spontaneous abortion Patient was given cytotec buccally in MAU. Also given Zofran for nausea and Toradol and Oxycodone for pain control. - Zofran, Naprosyn, and Oxycodone sent to patient's pharmacy - discussed bleeding expectations and return precautions  in detail - sent message to [facility] to switch patient's previously scheduled prenatal visit on 5/23 to SAB follow up - emotionally difficult to cope and would like to see [facility] provider. Included [facility] provider appt request in message to [facility].  Warner Mccreedy, MD, MPH OB Fellow, Faculty Practice

## 2021-12-26 MED ORDER — NAPROXEN 500 MG PO TABS: 500.0000 mg | ORAL_TABLET | Freq: Two times a day (BID) | ORAL | 0 refills | Status: AC

## 2021-12-26 MED ORDER — ONDANSETRON HCL 4 MG PO TABS: 4.0000 mg | ORAL_TABLET | Freq: Three times a day (TID) | ORAL | 0 refills | Status: AC | PRN

## 2021-12-26 MED ORDER — OXYCODONE HCL 5 MG PO TABS: 5.0000 mg | ORAL_TABLET | ORAL | 0 refills | Status: AC | PRN

## 2021-12-26 NOTE — Discharge Instructions (Addendum)
You came to the MAU because you had vaginal bleeding and cramping. We did an ultrasound and unfortunately you were found to be having a miscarriage. We discussed management of your miscarriage (letting your body naturally bleed or Medication or procedure) and you opted for medication management. We gave you a dose of the medication and also sent some pain and nausea medications to your pharmacy.  You can take Naprosyn (which is similar to ibuprofen) twice a day for your pain (you can take the next dose around 6AM). In addition to Naprosyn you can take Oxycodone as needed. You can take zofran for nausea as needed.  Please return if you have heavy bleeding that is so heavy that is soaking more than 2 pads completely for more than two hours in a row. Or if you are feeling dizziness, lightheadedness, or faint.

## 2021-12-28 NOTE — BH Specialist Note (Unsigned)
Integrated Behavioral Health Initial In-Person Visit  MRN: [id number] Name: Jasmine Ho  Number of [place] Clinician visits: No data recorded Session Start time: No data recorded   Session End time: No data recorded Total time in minutes: No data recorded  Types of Service: {CHL AMB TYPE OF [phone]}  Interpretor:{yes [id number] Interpretor Name and Language: ***   Warm Hand Off Completed.         Subjective: Jasmine Ho is a 41 y.o. female accompanied by {CHL AMB ACCOMPANIED [id number] Patient was referred by *** for ***. Patient reports the following symptoms/concerns: *** Duration of problem: ***; Severity of problem: {Mild/Moderate/Severe:20260}  Objective: Mood: {BHH MOOD:22306} and Affect: {BHH AFFECT:22307} Risk of harm to self or others: {CHL AMB BH Suicide Current Mental Status:21022748}  Life Context: Family and Social: *** School/Work: *** Self-Care: *** Life Changes: ***  Patient and/or Family's Strengths/Protective Factors: {CHL AMB BH PROTECTIVE FACTORS:[phone]}  Goals Addressed: Patient will: Reduce symptoms of: {IBH Symptoms:21014056} Increase knowledge and/or ability of: {IBH Patient Tools:21014057}  Demonstrate ability to: {IBH Goals:21014053}  Progress towards Goals: {CHL AMB BH PROGRESS TOWARDS [phone]}  Interventions: Interventions utilized: {IBH Interventions:21014054}  Standardized Assessments completed: {IBH Screening Tools:21014051}  Patient and/or Family Response: ***  Patient Centered Plan: Patient is on the following Treatment Plan(s):  ***  Assessment: Patient currently experiencing ***.   Patient may benefit from ***.  Plan: Follow up with behavioral health clinician on : *** Behavioral recommendations: *** Referral(s): {IBH Referrals:21014055} "From scale of 1-10, how likely are you to follow plan?": ***  Jasmine Hamman Melvine Julin, LCSW

## 2021-12-29 ENCOUNTER — Ambulatory Visit: Payer: Self-pay | Admitting: Clinical

## 2021-12-29 ENCOUNTER — Encounter: Payer: Self-pay | Admitting: Family Medicine

## 2021-12-29 ENCOUNTER — Ambulatory Visit (INDEPENDENT_AMBULATORY_CARE_PROVIDER_SITE_OTHER): Payer: BC Managed Care – PPO | Admitting: Family Medicine

## 2021-12-29 VITALS — BP 115/52 | HR 91 | Wt 186.9 lb

## 2021-12-29 DIAGNOSIS — O039 Complete or unspecified spontaneous abortion without complication: Secondary | ICD-10-CM | POA: Diagnosis not present

## 2021-12-29 DIAGNOSIS — Z77011 Contact with and (suspected) exposure to lead: Secondary | ICD-10-CM | POA: Diagnosis not present

## 2021-12-29 DIAGNOSIS — F4321 Adjustment disorder with depressed mood: Secondary | ICD-10-CM

## 2021-12-29 NOTE — Progress Notes (Unsigned)
GYNECOLOGY OFFICE VISIT NOTE  History:   Jasmine Ho is a 41 y.o. [id number] here today for follow up after SAB.   Seen on 5/19 for bleeding and cramping in MAU. [place] with CRL c/w [redacted]w[redacted]d gestation with no cardiac activity  Treated with 800 of buccal Cytotec in MAU Had pain and bleeding that was heavy over the next few hours Now just has very light spotting Denies any abdominal pain at this time Reports she is coping okay Sometimes feels fine, sometimes is sad and cries States that this is her second miscarriage this year so far Wondering why this is happening  Reports she easily gets pregnant, this is not the issue She is exposed to lead at work and had a level checked recently which was high She is wondering if this could be related  She has had two prior pregnancies that she was able to carry to term This was with a different partner She is wondering if she even wants to keep trying to get pregnant at this time She thinks she would like to wait a little while and think about it Previously using OCPs for birth control; thinks she would like to use this again for contraception  She would also like her lead levels checked again Working less with lead now but is still exposed some  She denies any current abdominal pain or bleeding  She is interested in meeting with behavioral health today   Past Medical History:  Diagnosis Date   Complication of anesthesia    Medical history non-contributory     Past Surgical History:  Procedure Laterality Date   CESAREAN SECTION     The following portions of the patient's history were reviewed and updated as appropriate: allergies, current medications, past family history, past medical history, past social history, past surgical history and problem list.  Review of Systems:  Pertinent items noted in HPI and remainder of comprehensive ROS otherwise negative.  Physical Exam:   BP (!) 115/52   Pulse 91   Wt 186 lb 14.4 oz (84.8 kg)   LMP  10/16/2021 (Exact Date)   Breastfeeding Unknown   BMI 29.27 kg/m   CONSTITUTIONAL: Well-developed, well-nourished female in no acute distress.  HEENT:  Normocephalic, atraumatic. EOMI, conjunctivae clear. CARDIOVASCULAR: Normal heart rate noted. RESPIRATORY: Normal work of breathing on room air.  ABDOMEN: Soft, nontender.  SKIN: No rashes or lesions noted. MUSCULOSKELETAL: Normal range of motion. No LE edema noted. NEUROLOGIC: Alert and oriented to person, place, and time. No focal deficit noted.  PSYCHIATRIC: Normal mood and affect. Normal behavior. Normal judgment and thought content.  Assessment and Plan:   1. SAB (spontaneous abortion) Progressing as expected. Pain now resolved, only having scant spotting. Counseled regarding expected time course for symptoms to resolve. Coping as expected as well. Will meet with [facility] today after visit. Will have patient return for repeat hCG in 2 weeks. Once level returns to normal, will start OCPs for contraception per patient preference. Offered referral to fertility specialist; however, patient would like to think about this further before proceeding. Will reassess once labs result.   - Beta hCG quant (ref lab); Future  2. Lead exposure Last lead level 19.4 on 1/20. Previously discussed that this could be a factor contributing to her miscarriages, though, emphasized that there are many other factors that can contribute as well. Will repeat lead level to assess trend and possible need for treatment if above recommended threshold. Recommended limited exposure as able, patient voiced  understanding.  - Lead, blood (adult age 42 yrs or greater); Future  Return in about 2 weeks (around 01/12/2022) for follow up lab visit (repeat hCG/lead level).    In-person Spanish interpreter used for entirety of visit.   Vilma Meckel, MD OB Fellow, [place] for [facility]

## 2021-12-29 NOTE — Patient Instructions (Signed)
Center for [facility] at [facility] [place], [place] [address] [phone] (main office) [phone] ([place] office)  [email or web address] (Pregnancy Loss Support Groups)

## 2022-01-01 ENCOUNTER — Other Ambulatory Visit

## 2022-01-05 ENCOUNTER — Telehealth: Payer: Self-pay | Admitting: Clinical

## 2022-01-05 NOTE — Telephone Encounter (Signed)
Attempt to follow-up call, via [employer], [phone]; Left HIPPA-compliant message to call back Asher Muir from Center for [employer] at [facility] at  [phone] [facility] office).

## 2022-01-06 DIAGNOSIS — Z23 Encounter for immunization: Secondary | ICD-10-CM | POA: Diagnosis not present

## 2022-01-07 DIAGNOSIS — Z0289 Encounter for other administrative examinations: Secondary | ICD-10-CM | POA: Diagnosis not present

## 2022-01-12 ENCOUNTER — Other Ambulatory Visit

## 2022-01-12 DIAGNOSIS — Z77011 Contact with and (suspected) exposure to lead: Secondary | ICD-10-CM

## 2022-01-12 DIAGNOSIS — O039 Complete or unspecified spontaneous abortion without complication: Secondary | ICD-10-CM

## 2022-01-13 DIAGNOSIS — N96 Recurrent pregnancy loss: Secondary | ICD-10-CM | POA: Diagnosis not present

## 2022-01-14 ENCOUNTER — Telehealth: Payer: Self-pay | Admitting: Clinical

## 2022-01-14 LAB — BETA HCG QUANT (REF LAB): hCG Quant: 4 m[IU]/mL

## 2022-01-14 LAB — LEAD, BLOOD (ADULT >= 16 YRS): Lead-Whole Blood: 7.2 ug/dL — ABNORMAL HIGH (ref 0.0–3.4)

## 2022-01-14 NOTE — Telephone Encounter (Signed)
Attempt follow up call; Left HIPPA-compliant message to call back Asher Muir from [employer] for [employer] at [facility] at  [phone] [facility] office).

## 2022-01-19 ENCOUNTER — Telehealth: Admitting: Physician Assistant

## 2022-01-19 DIAGNOSIS — R3989 Other symptoms and signs involving the genitourinary system: Secondary | ICD-10-CM | POA: Diagnosis not present

## 2022-01-19 DIAGNOSIS — B379 Candidiasis, unspecified: Secondary | ICD-10-CM | POA: Diagnosis not present

## 2022-01-19 MED ORDER — FLUCONAZOLE 150 MG PO TABS: 150.0000 mg | ORAL_TABLET | Freq: Once | ORAL | 0 refills | Status: AC

## 2022-01-19 MED ORDER — CEPHALEXIN 500 MG PO CAPS: 500.0000 mg | ORAL_CAPSULE | Freq: Two times a day (BID) | ORAL | 0 refills | Status: AC

## 2022-01-19 NOTE — Progress Notes (Signed)
I have spent 5 minutes in review of e-visit questionnaire, review and updating patient chart, medical decision making and response to patient.   Italia Wolfert Cody Everlene Cunning, PA-C    

## 2022-01-19 NOTE — Progress Notes (Signed)
E-Visit for Urinary Problems  We are sorry that you are not feeling well.  Here is how we plan to help!  Based on what you shared with me it looks like you most likely have a simple urinary tract infection along with a mild yeast infection likely due to recent stress on the body and things trying to regulate -- pH, etc -- after your miscarriage. I am starting treatment (see below) but want you to have a follow-up with your OBGYN if symptoms are not quickly resolving.   A UTI (Urinary Tract Infection) is a bacterial infection of the bladder.  Most cases of urinary tract infections are simple to treat but a key part of your care is to encourage you to drink plenty of fluids and watch your symptoms carefully.  I have prescribed Keflex 500 mg twice a day for 7 days.  I have also sent in Diflucan to take once and repeat in 3 days. Your symptoms should gradually improve. Call us if the burning in your urine worsens, you develop worsening fever, back pain or pelvic pain or if your symptoms do not resolve after completing the antibiotic.  Urinary tract infections can be prevented by drinking plenty of water to keep your body hydrated.  Also be sure when you wipe, wipe from front to back and don't hold it in!  If possible, empty your bladder every 4 hours.  HOME CARE Drink plenty of fluids Compete the full course of the antibiotics even if the symptoms resolve Remember, when you need to go.go. Holding in your urine can increase the likelihood of getting a UTI! GET HELP RIGHT AWAY IF: You cannot urinate You get a high fever Worsening back pain occurs You see blood in your urine You feel sick to your stomach or throw up You feel like you are going to pass out  MAKE SURE YOU  Understand these instructions. Will watch your condition. Will get help right away if you are not doing well or get worse.   Thank you for choosing an e-visit.  Your e-visit answers were reviewed by a board certified  advanced clinical practitioner to complete your personal care plan. Depending upon the condition, your plan could have included both over the counter or prescription medications.  Please review your pharmacy choice. Make sure the pharmacy is open so you can pick up prescription now. If there is a problem, you may contact your provider through [employer] and have the prescription routed to another pharmacy.  Your safety is important to [place]. If you have drug allergies check your prescription carefully.   For the next 24 hours you can use MyChart to ask questions about today's visit, request a non-urgent call back, or ask for a work or school excuse. You will get an email in the next two days asking about your experience. I hope that your e-visit has been valuable and will speed your recovery.

## 2022-01-21 ENCOUNTER — Telehealth: Payer: Self-pay

## 2022-01-21 NOTE — Telephone Encounter (Signed)
-----   Message from Worthy Rancher, MD sent at 01/20/2022  8:40 PM EDT ----- Hi there!   I sent sent this patient a message about her results and asked if she wanted to start birth control pills now (after her miscarriage) like we discussed at her visit. Can we check in on her? It looks like she read the message but did not respond. Let me know!   -Evalina Field  ----- Message ----- From: Nell [facility] Lab Results In Sent: 01/14/2022   1:36 PM EDT To: Worthy Rancher, MD

## 2022-01-21 NOTE — Telephone Encounter (Signed)
I attempted to call patient with the help of spanish interpreter Christian ID [phone] from [employer]. Patient did not answer phone call, voicemail left requesting patient give [place] a callback.   Alesia Richards, RN 01/21/22

## 2022-01-25 NOTE — Telephone Encounter (Signed)
I attempted to call patient again with [employer] spanish interpreter [place] ID# [phone]. Patient did not answer, voicemail left requesting patient call us back.   Alesia Richards, RN 01/25/22

## 2022-02-05 DIAGNOSIS — Z77011 Contact with and (suspected) exposure to lead: Secondary | ICD-10-CM | POA: Diagnosis not present

## 2022-03-02 ENCOUNTER — Other Ambulatory Visit

## 2022-03-02 ENCOUNTER — Ambulatory Visit

## 2022-04-06 DIAGNOSIS — Z6829 Body mass index (BMI) 29.0-29.9, adult: Secondary | ICD-10-CM | POA: Diagnosis not present

## 2022-04-06 DIAGNOSIS — Z1239 Encounter for other screening for malignant neoplasm of breast: Secondary | ICD-10-CM | POA: Diagnosis not present

## 2022-04-06 DIAGNOSIS — Z01419 Encounter for gynecological examination (general) (routine) without abnormal findings: Secondary | ICD-10-CM | POA: Diagnosis not present

## 2022-04-06 DIAGNOSIS — Z124 Encounter for screening for malignant neoplasm of cervix: Secondary | ICD-10-CM | POA: Diagnosis not present

## 2023-02-17 IMAGING — US US OB < 14 WEEKS - US OB TV
1 series · 15 of 28 positions shown · non-contrast
Comparison: Early obstetric ultrasound 08/25/2021

CLINICAL DATA: Pregnant patient with pain and spotting. Gestational
age based on LMP 9 weeks 4 days.

EXAM:
OBSTETRIC <14 WK US AND TRANSVAGINAL OB US
TECHNIQUE: Both transabdominal and transvaginal ultrasound examinations were
performed for complete evaluation of the gestation as well as the
maternal uterus, adnexal regions, and pelvic cul-de-sac.
Transvaginal technique was performed to assess early pregnancy.

[Series 1: us ob < 14 weeks - us ob tv · 64 acquisitions, 15 frames shown]
[im 1/64]
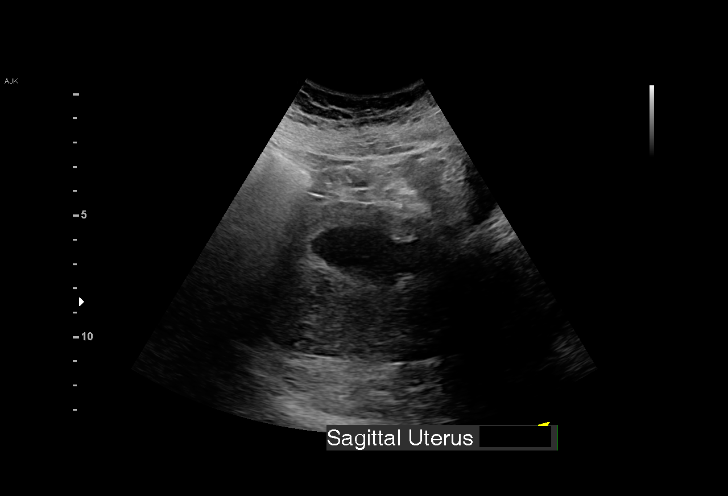
[im 5/64]
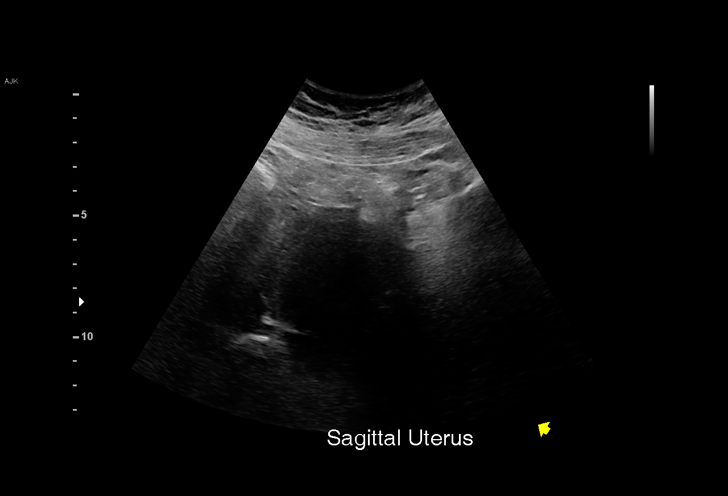
[im 10/64]
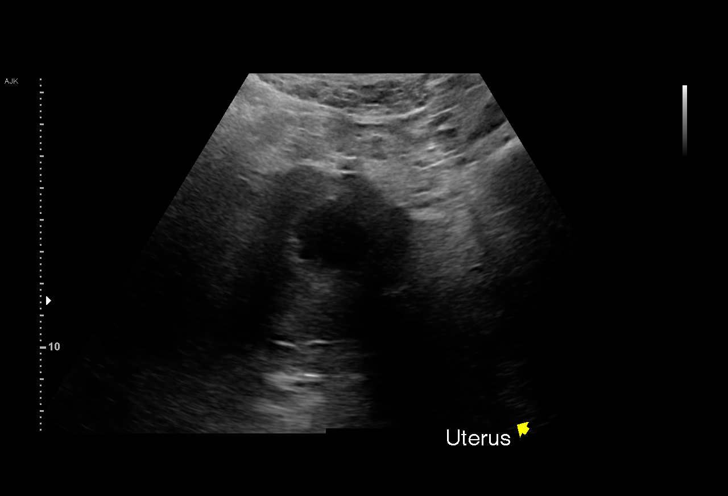
[im 15/64]
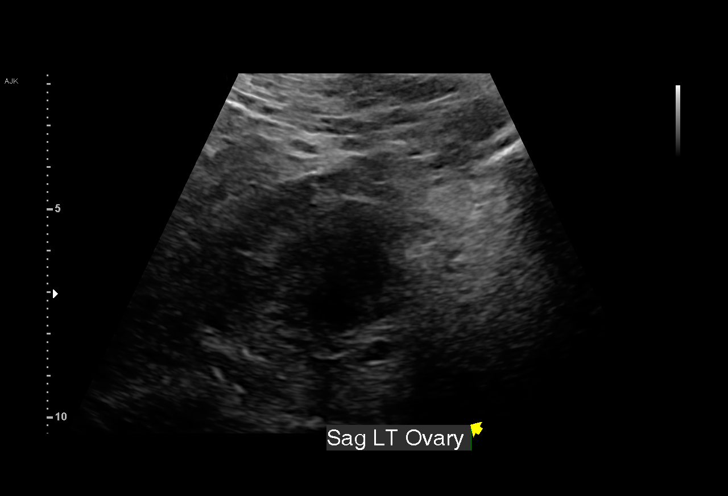
[im 19/64]
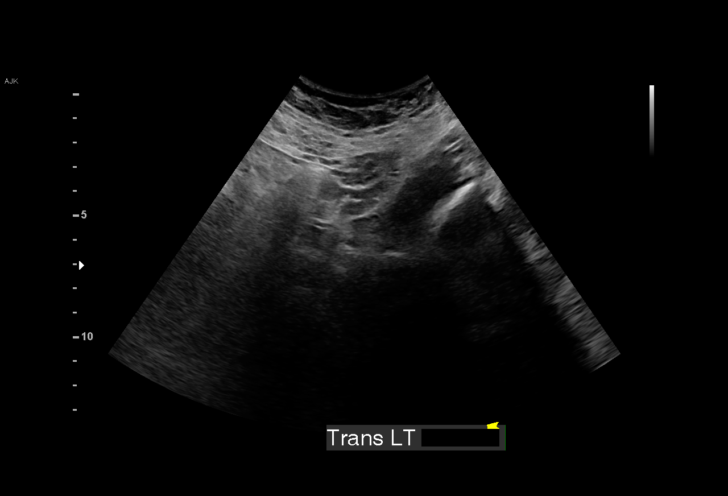
[im 24/64]
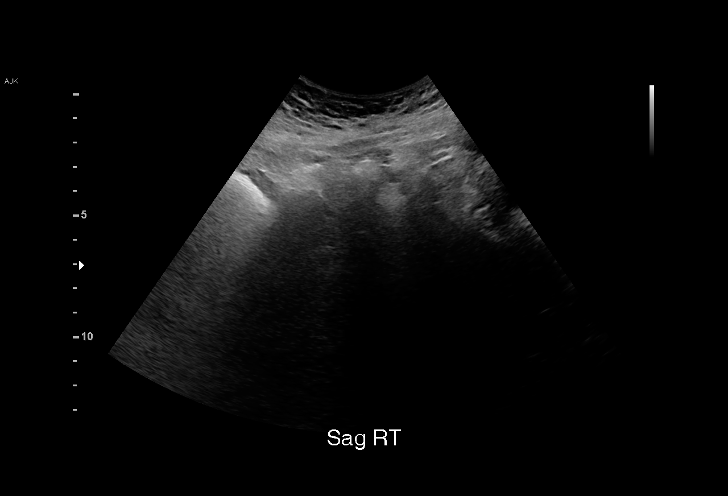
[im 29/64]
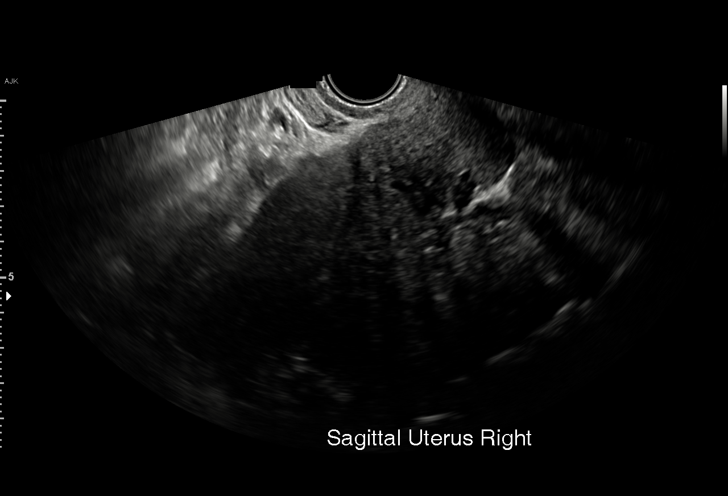
[im 33/64]
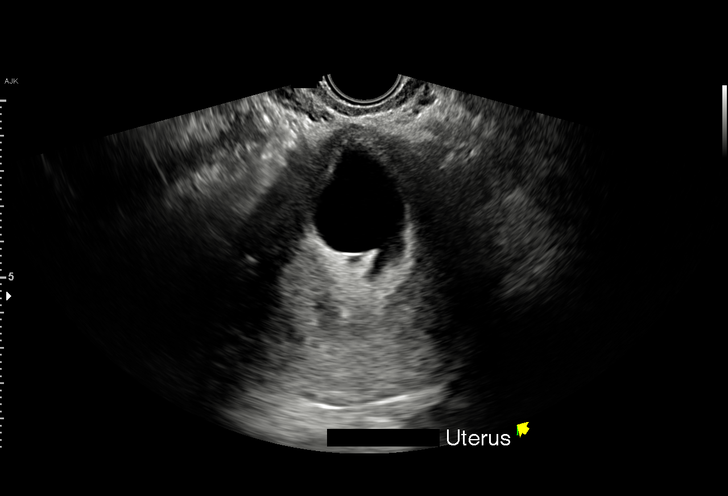
[im 36/64]
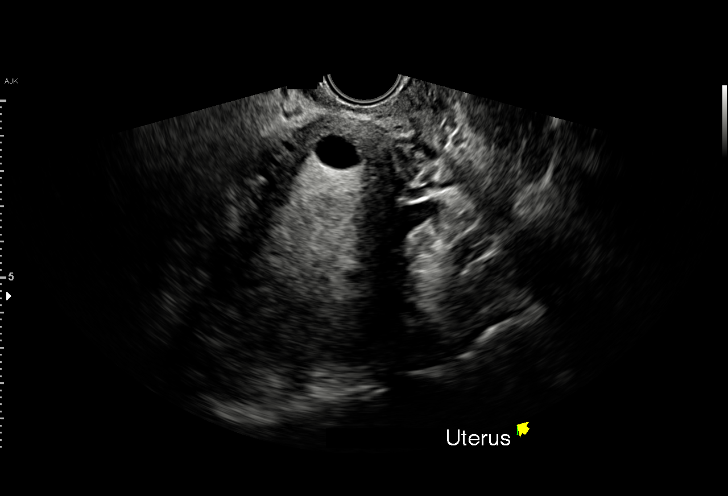
[im 40/64]
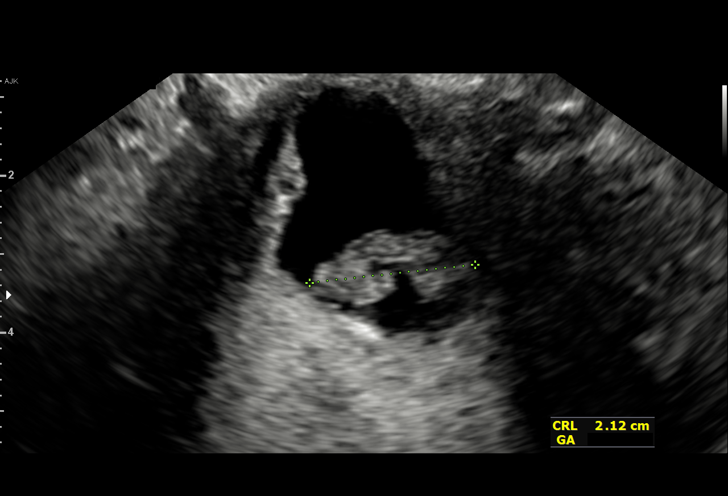
[im 45/64]
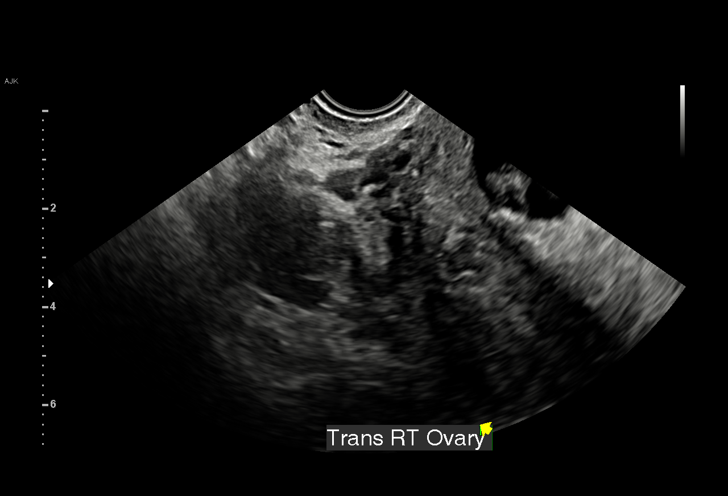
[im 50/64]
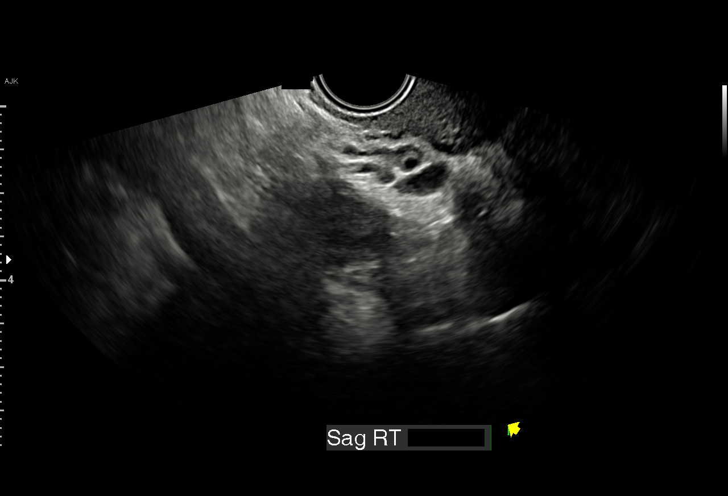
[im 54/64]
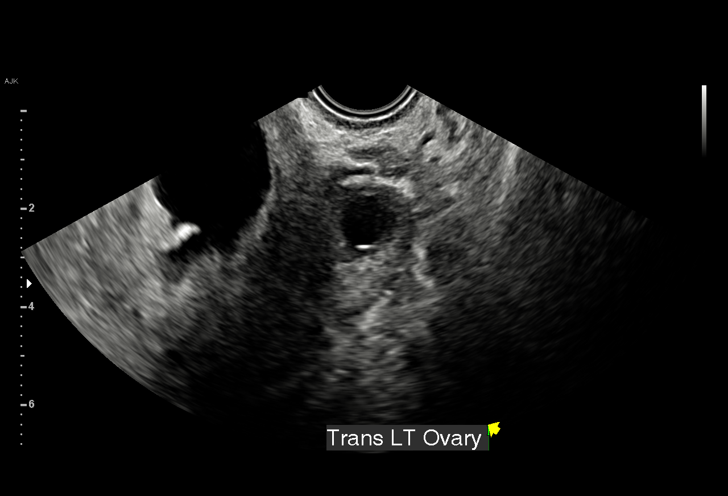
[im 59/64]
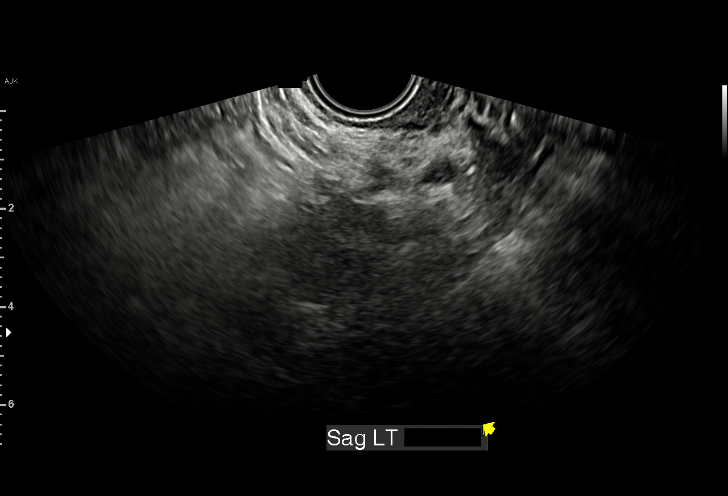
[im 64/64]
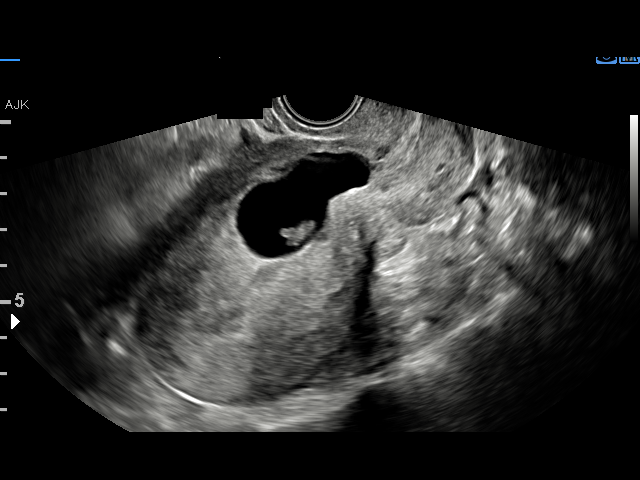

[15 of 28 positions shown; findings below may reference images not displayed]

FINDINGS: Intrauterine gestational sac: Single, located in the lower uterine
segment

Yolk sac:  Not visualized

Embryo:  Visualized.

Cardiac Activity: Not Visualized.

Heart Rate: 0  bpm

CRL:  22.3 mm   8 w   6 d                  US EDC: 07/31/2022

Subchorionic hemorrhage:  None visualized.

Maternal uterus/adnexae: Intrauterine gestational sac is located
within the lower uterine segment. No yolk sac visualized. Fetal pole
without fetal cardiac activity. There is a small area of separation
between the amnion and chorion. Both ovaries are visualized and are
normal, possible corpus luteal cyst on the left. Trace pelvic free
fluid.
IMPRESSION: Single intrauterine pregnancy, fetal pole with crown-rump length of
22 mm but absent fetal heart tones. Findings meet definitive
criteria for failed pregnancy. This follows SRU consensus
guidelines: Diagnostic Criteria for Nonviable Pregnancy Early in the
First Trimester. N Engl J Med 0047;[DATE].

## 2023-05-25 ENCOUNTER — Encounter: Payer: Self-pay | Admitting: Physician Assistant

## 2023-05-25 ENCOUNTER — Other Ambulatory Visit: Payer: Self-pay | Admitting: Obstetrics and Gynecology

## 2023-05-25 DIAGNOSIS — R928 Other abnormal and inconclusive findings on diagnostic imaging of breast: Secondary | ICD-10-CM

## 2023-06-02 ENCOUNTER — Ambulatory Visit
Admission: RE | Admit: 2023-06-02 | Discharge: 2023-06-02 | Disposition: A | Discharge status: Home / Self Care | Payer: BC Managed Care – PPO | Source: Ambulatory Visit | Attending: Obstetrics and Gynecology | Admitting: Obstetrics and Gynecology

## 2023-06-02 ENCOUNTER — Ambulatory Visit
Admission: RE | Admit: 2023-06-02 | Discharge: 2023-06-02 | Disposition: A | Discharge status: Home / Self Care | Source: Ambulatory Visit | Attending: Obstetrics and Gynecology | Admitting: Obstetrics and Gynecology

## 2023-06-02 ENCOUNTER — Other Ambulatory Visit: Payer: Self-pay | Admitting: Obstetrics and Gynecology

## 2023-06-02 DIAGNOSIS — R928 Other abnormal and inconclusive findings on diagnostic imaging of breast: Secondary | ICD-10-CM

## 2023-06-02 DIAGNOSIS — N6311 Unspecified lump in the right breast, upper outer quadrant: Secondary | ICD-10-CM

## 2023-11-17 ENCOUNTER — Ambulatory Visit (INDEPENDENT_AMBULATORY_CARE_PROVIDER_SITE_OTHER): Admitting: Family Medicine

## 2023-11-17 VITALS — BP 114/69 | HR 79 | Temp 98.4°F | Resp 18 | Ht 67.0 in | Wt 138.7 lb

## 2023-11-17 DIAGNOSIS — R002 Palpitations: Secondary | ICD-10-CM

## 2023-11-17 DIAGNOSIS — F418 Other specified anxiety disorders: Secondary | ICD-10-CM

## 2023-11-17 DIAGNOSIS — Z1329 Encounter for screening for other suspected endocrine disorder: Secondary | ICD-10-CM | POA: Diagnosis not present

## 2023-11-17 DIAGNOSIS — F410 Panic disorder [episodic paroxysmal anxiety] without agoraphobia: Secondary | ICD-10-CM

## 2023-11-17 DIAGNOSIS — Z7689 Persons encountering health services in other specified circumstances: Secondary | ICD-10-CM

## 2023-11-17 MED ORDER — FLUOXETINE HCL 10 MG PO TABS
10.0000 mg | ORAL_TABLET | Freq: Every day | ORAL | 1 refills | Status: AC
Start: 2023-11-17 — End: ?

## 2023-11-17 NOTE — Progress Notes (Signed)
 New Patient Office Visit  Subjective    Patient ID: Jasmine Ho, female    DOB: 09/10/1980  Age: 43 y.o. MRN: [id number]  CC:  Chief Complaint  Patient presents with   Establish Care    Patient is here to establish care with a new PCP   Panic Attack    Patient states that she has been having panic attacks that have been stronger within the last 4 months, with chest pressure shortness of breath,  hand and face numbness, and blurry vision    HPI Jasmine Ho presents to establish care. Pt is new to me. She is here with husband.  Pt reports she started taking Semaglutide for the last 6 months. She reports this dose was increased in December from 10mg  to 15mg . She says she feels like she's having more chest pressure and feeling panicky. She spoke to the provider and the dose was decreased from 15mg  to 10mg  .  She reports her symptoms include pressure in her chest and feeling throat tightness. She also has some mouth dryness. In her face, she feels tingling sensations also in her hands. She reports 50 lbs lost in 6 months. She also reports palpitations during this time. She does report going through therapy. She was prescribed Celexa 10mg  in the past, in 2023 per chart review. Pt reports she never picked this up to take.   Flowsheet Row Office Visit from 11/17/2023 in [facility]  PHQ-9 Total Score 22          11/17/2023    2:27 PM 12/29/2021    2:41 PM  GAD 7 : Generalized Anxiety Score  Nervous, Anxious, on Edge 3 0  Control/stop worrying 3 0  Worry too much - different things 3 1  Trouble relaxing 3 0  Restless 3 0  Easily annoyed or irritable 1 0  Afraid - awful might happen 3 0  Total GAD 7 Score 19 1  Anxiety Difficulty Somewhat difficult      Outpatient Encounter Medications as of 11/17/2023  Medication Sig   citalopram (CELEXA) 10 MG tablet Take 1 tablet (10 mg total) by mouth daily. (Patient not taking: Reported on 11/17/2023)    oxyCODONE (OXY IR/ROXICODONE) 5 MG immediate release tablet Take 1 tablet (5 mg total) by mouth every 4 (four) hours as needed for severe pain. (Patient not taking: Reported on 11/17/2023)   Prenatal Vit-Fe Fumarate-FA (MULTIVITAMIN-PRENATAL) 27-0.8 MG TABS tablet Take 1 tablet by mouth daily at 12 noon. (Patient not taking: Reported on 11/17/2023)   No facility-administered encounter medications on file as of 11/17/2023.    Past Medical History:  Diagnosis Date   Allergy 2022   Latex   Anxiety 2024   Complication of anesthesia     Past Surgical History:  Procedure Laterality Date   CESAREAN SECTION      Family History  Problem Relation Age of Onset   Cancer Mother    Hypertension Mother    Depression Daughter     Social History   Socioeconomic History   Marital status: Married    Spouse name: Not on file   Number of children: Not on file   Years of education: Not on file   Highest education level: Master's degree (e.g., MA, MS, MEng, MEd, MSW, MBA)  Occupational History   Not on file  Tobacco Use   Smoking status: Never    Passive exposure: Never   Smokeless tobacco: Never  Vaping Use  Vaping status: Never Used  Substance and Sexual Activity   Alcohol use: Yes    Comment: rarley   Drug use: Never   Sexual activity: Yes    Birth control/protection: None  Other Topics Concern   Not on file  Social History Narrative   Not on file   Social Drivers of Health   Financial Resource Strain: Low Risk  (11/17/2023)   Overall Financial Resource Strain (CARDIA)    Difficulty of Paying Living Expenses: Not very hard  Food Insecurity: No Food Insecurity (11/17/2023)   Hunger Vital Sign    Worried About Running Out of Food in the Last Year: Never true    Ran Out of Food in the Last Year: Never true  Transportation Needs: No Transportation Needs (11/17/2023)   PRAPARE - Administrator, Civil Service (Medical): No    Lack of Transportation (Non-Medical): No   Physical Activity: Sufficiently Active (11/17/2023)   Exercise Vital Sign    Days of Exercise per Week: 2 days    Minutes of Exercise per Session: 90 min  Stress: Stress Concern Present (11/17/2023)   [employer] of Occupational Health - Occupational Stress Questionnaire    Feeling of Stress : Very much  Social Connections: Moderately Isolated (11/17/2023)   Social Connection and Isolation Panel [NHANES]    Frequency of Communication with Friends and Family: Twice a week    Frequency of Social Gatherings with Friends and Family: Never    Attends Religious Services: 1 to 4 times per year    Active Member of [employer] or Organizations: No    Attends Engineer, structural: Not on file    Marital Status: Married  Intimate Partner Violence: Unknown (07/13/2022)   Received from [facility]   HITS    Physically Hurt: Not on file    Insult or Talk Down To: Not on file    Threaten Physical Harm: Not on file    Scream or Curse: Not on file    Review of Systems  Cardiovascular:  Positive for palpitations.  Neurological:  Positive for tingling.  Psychiatric/Behavioral:  Positive for depression. The patient is nervous/anxious and has insomnia.   All other systems reviewed and are negative.      Objective    BP 114/69   Pulse 79   Temp 98.4 F (36.9 C) (Oral)   Resp 18   Ht 5\' 7"  (1.702 m)   Wt 138 lb 11.2 oz (62.9 kg)   LMP 10/22/2023 (Exact Date)   SpO2 99%   BMI 21.72 kg/m   Physical Exam Vitals and nursing note reviewed.  Constitutional:      Appearance: Normal appearance. She is normal weight.  HENT:     Head: Normocephalic and atraumatic.     Right Ear: Tympanic membrane and external ear normal.     Left Ear: External ear normal.     Nose: Nose normal.     Mouth/Throat:     Mouth: Mucous membranes are moist.     Pharynx: Oropharynx is clear.  Eyes:     Conjunctiva/sclera: Conjunctivae normal.     Pupils: Pupils are equal, round, and reactive to light.   Cardiovascular:     Rate and Rhythm: Normal rate.  Pulmonary:     Effort: Pulmonary effort is normal.  Skin:    General: Skin is warm.     Capillary Refill: Capillary refill takes less than 2 seconds.  Neurological:     General: No focal deficit  present.     Mental Status: She is alert and oriented to person, place, and time. Mental status is at baseline.  Psychiatric:        Mood and Affect: Mood normal.        Behavior: Behavior normal.        Thought Content: Thought content normal.        Judgment: Judgment normal.       Assessment & Plan:   Problem List Items Addressed This Visit   None  Encounter to establish care with new doctor  Depression with anxiety -     FLUoxetine HCl; Take 1 tablet (10 mg total) by mouth daily.  Dispense: 30 tablet; Refill: 1 -     Ambulatory referral to Behavioral Health  Screening for thyroid disorder -     TSH + free T4  Panic attacks -     Ambulatory referral to Behavioral Health  Palpitations -     Basic metabolic panel with GFR -     Magnesium  Pt with symptoms consistent with panic attacks. She has hx of being prescribed Celexa but never took it. She continues to use her therapy sessions via telehealth. She would like to pursue local counseling  as her counselor is in [place]. Will start trial of Prozac 10mg  daily and see back in 4 weeks.  Checking thyroid function along with electrolytes. No follow-ups on file.   Suzan Slick, MD

## 2023-11-18 ENCOUNTER — Encounter: Payer: Self-pay | Admitting: Family Medicine

## 2023-11-18 DIAGNOSIS — G4709 Other insomnia: Secondary | ICD-10-CM

## 2023-11-18 LAB — TSH+FREE T4
Free T4: 1.02 ng/dL (ref 0.82–1.77)
TSH: 1.46 u[IU]/mL (ref 0.450–4.500)

## 2023-11-18 LAB — BASIC METABOLIC PANEL WITH GFR
BUN/Creatinine Ratio: 11 (ref 9–23)
BUN: 7 mg/dL (ref 6–24)
CO2: 23 mmol/L (ref 20–29)
Calcium: 9.1 mg/dL (ref 8.7–10.2)
Chloride: 105 mmol/L (ref 96–106)
Creatinine, Ser: 0.65 mg/dL (ref 0.57–1.00)
Glucose: 86 mg/dL (ref 70–99)
Potassium: 4.2 mmol/L (ref 3.5–5.2)
Sodium: 142 mmol/L (ref 134–144)
eGFR: 112 mL/min/{1.73_m2} (ref >59)

## 2023-11-18 LAB — MAGNESIUM: Magnesium: 2.2 mg/dL (ref 1.6–2.3)

## 2023-11-24 MED ORDER — TRAZODONE HCL 50 MG PO TABS: 25.0000 mg | ORAL_TABLET | Freq: Every evening | ORAL | 0 refills | Status: AC | PRN

## 2023-11-24 NOTE — Addendum Note (Signed)
 Addended by: Manette Section on: 11/24/2023 08:31 AM   Modules accepted: Orders

## 2023-12-02 ENCOUNTER — Other Ambulatory Visit

## 2023-12-15 ENCOUNTER — Ambulatory Visit: Admitting: Family Medicine

## 2023-12-21 ENCOUNTER — Ambulatory Visit
Admission: RE | Admit: 2023-12-21 | Discharge: 2023-12-21 | Disposition: A | Discharge status: Home / Self Care | Source: Ambulatory Visit | Attending: Obstetrics and Gynecology | Admitting: Obstetrics and Gynecology

## 2023-12-21 ENCOUNTER — Other Ambulatory Visit: Payer: Self-pay | Admitting: Family Medicine

## 2023-12-21 ENCOUNTER — Other Ambulatory Visit: Payer: Self-pay | Admitting: Obstetrics and Gynecology

## 2023-12-21 DIAGNOSIS — N6311 Unspecified lump in the right breast, upper outer quadrant: Secondary | ICD-10-CM

## 2023-12-21 DIAGNOSIS — R928 Other abnormal and inconclusive findings on diagnostic imaging of breast: Secondary | ICD-10-CM

## 2023-12-21 DIAGNOSIS — N6310 Unspecified lump in the right breast, unspecified quadrant: Secondary | ICD-10-CM

## 2023-12-22 NOTE — Telephone Encounter (Signed)
 Prior auth for: FLUOXENTINE Determination: PENDING Auth #: [id number] Valid from: N/A

## 2023-12-23 NOTE — Telephone Encounter (Signed)
 Prior auth for: FLUOXENTINE Determination: DENIED Auth #: [id number] Reason:   Patient notified via MyChart msg.

## 2024-03-13 ENCOUNTER — Other Ambulatory Visit: Payer: Self-pay | Admitting: Obstetrics and Gynecology

## 2024-03-13 DIAGNOSIS — N6310 Unspecified lump in the right breast, unspecified quadrant: Secondary | ICD-10-CM

## 2024-06-25 ENCOUNTER — Ambulatory Visit
Admission: RE | Admit: 2024-06-25 | Discharge: 2024-06-25 | Disposition: A | Discharge status: Home / Self Care | Source: Ambulatory Visit | Attending: Obstetrics and Gynecology | Admitting: Obstetrics and Gynecology

## 2024-06-25 ENCOUNTER — Ambulatory Visit
Admission: RE | Admit: 2024-06-25 | Discharge: 2024-06-25 | Disposition: A | Source: Ambulatory Visit | Attending: Obstetrics and Gynecology | Admitting: Obstetrics and Gynecology

## 2024-06-25 DIAGNOSIS — N6310 Unspecified lump in the right breast, unspecified quadrant: Secondary | ICD-10-CM
